# Patient Record
Sex: Male | Born: 1985 | Race: White | Hispanic: No | Marital: Married | Smoking: Current every day smoker
Health system: Southern US, Community
[De-identification: ages and names within clinical notes are randomized; demographics above are authoritative.]

## PROBLEM LIST (undated history)

## (undated) HISTORY — PX: HERNIA REPAIR: SHX51

---

## 2014-04-15 ENCOUNTER — Emergency Department (HOSPITAL_COMMUNITY)
Admission: EM | Admit: 2014-04-15 | Discharge: 2014-04-15 | Disposition: A | Discharge status: Home / Self Care | Payer: No Typology Code available for payment source | Attending: Emergency Medicine | Admitting: Emergency Medicine

## 2014-04-15 ENCOUNTER — Emergency Department (HOSPITAL_COMMUNITY): Payer: No Typology Code available for payment source

## 2014-04-15 ENCOUNTER — Encounter (HOSPITAL_COMMUNITY): Payer: Self-pay | Admitting: Emergency Medicine

## 2014-04-15 DIAGNOSIS — N200 Calculus of kidney: Secondary | ICD-10-CM | POA: Insufficient documentation

## 2014-04-15 DIAGNOSIS — F17200 Nicotine dependence, unspecified, uncomplicated: Secondary | ICD-10-CM | POA: Insufficient documentation

## 2014-04-15 DIAGNOSIS — R109 Unspecified abdominal pain: Secondary | ICD-10-CM | POA: Insufficient documentation

## 2014-04-15 LAB — URINE MICROSCOPIC-ADD ON

## 2014-04-15 LAB — URINALYSIS, ROUTINE W REFLEX MICROSCOPIC
BILIRUBIN URINE: NEGATIVE
Glucose, UA: NEGATIVE mg/dL
Ketones, ur: NEGATIVE mg/dL
Leukocytes, UA: NEGATIVE
Nitrite: NEGATIVE
Protein, ur: NEGATIVE mg/dL
Specific Gravity, Urine: 1.022 (ref 1.005–1.030)
UROBILINOGEN UA: 0.2 mg/dL (ref 0.0–1.0)
pH: 6.5 (ref 5.0–8.0)

## 2014-04-15 MED ORDER — ONDANSETRON HCL 4 MG/2ML IJ SOLN
4.0000 mg | Freq: Once | INTRAMUSCULAR | Status: AC
  Administered 2014-04-15: 4 mg via INTRAVENOUS
  Filled 2014-04-15: qty 2

## 2014-04-15 MED ORDER — MORPHINE SULFATE 4 MG/ML IJ SOLN
4.0000 mg | Freq: Once | INTRAMUSCULAR | Status: AC
  Administered 2014-04-15: 4 mg via INTRAVENOUS
  Filled 2014-04-15: qty 1

## 2014-04-15 MED ORDER — OXYCODONE-ACETAMINOPHEN 5-325 MG PO TABS: 1.0000 | ORAL_TABLET | Freq: Four times a day (QID) | ORAL | Status: DC | PRN

## 2014-04-15 MED ORDER — ONDANSETRON HCL 4 MG PO TABS: 4.0000 mg | ORAL_TABLET | Freq: Three times a day (TID) | ORAL | Status: DC | PRN

## 2014-04-15 MED ORDER — TAMSULOSIN HCL 0.4 MG PO CAPS
0.4000 mg | ORAL_CAPSULE | Freq: Every day | ORAL | Status: DC
Start: 2014-04-15 — End: 2014-07-26

## 2014-04-15 NOTE — ED Notes (Signed)
Per pt report: 3 hours prior to arrival, pt got up to use the bathroom and was unable to urinate. Pt then began to experience sever right flank pain.  Pain does not radiate.  Pt reports family hx kidney stone.  Pt a/o x 4.

## 2014-04-15 NOTE — ED Provider Notes (Signed)
CSN: [id number]     Arrival date & time 04/15/14  [id number] History   First MD Initiated Contact with Patient 04/15/14 [phone]     Chief Complaint  Patient presents with  . Flank Pain     (Consider location/radiation/quality/duration/timing/severity/associated sxs/prior Treatment) HPI Comments: Patient is a 28 yo M presenting to the ED for acute onset R flank pain with radiation into abdomen at three AM this morning with associated nausea. Alleviating factors: none. Aggravating factors: certain positions. Medications tried prior to arrival: none. Denies any fevers, emesis, diarrhea, constipation. Abdominal surgical history: hernia repair.     Patient is a 28 y.o. male presenting with flank pain.  Flank Pain This is a new (R flank) problem. The current episode started today. The problem occurs constantly. The problem has been waxing and waning. Associated symptoms include abdominal pain and nausea. Pertinent negatives include no arthralgias, fever, headaches, numbness, rash, vomiting or weakness. Exacerbated by: Certain positions. He has tried nothing for the symptoms. The treatment provided no relief.    History reviewed. No pertinent past medical history. Past Surgical History  Procedure Laterality Date  . Hernia repair     No family history on file. History  Substance Use Topics  . Smoking status: Current Every Day Smoker -- 1.00 packs/day    Types: Cigarettes  . Smokeless tobacco: Not on file  . Alcohol Use: Yes     Comment: rarley    Review of Systems  Constitutional: Negative for fever.  Gastrointestinal: Positive for nausea and abdominal pain. Negative for vomiting.  Genitourinary: Positive for urgency, frequency, flank pain and decreased urine volume. Negative for dysuria and hematuria.  Musculoskeletal: Negative for arthralgias.  Skin: Negative for rash.  Neurological: Negative for weakness, numbness and headaches.  All other systems reviewed and are  negative.     Allergies  Review of patient's allergies indicates no known allergies.  Home Medications   Prior to Admission medications   Medication Sig Start Date End Date Taking? Authorizing Provider  ondansetron (ZOFRAN) 4 MG tablet Take 1 tablet (4 mg total) by mouth every 8 (eight) hours as needed for nausea or vomiting. 04/15/14   Lise AuerJennifer L Amr Sturtevant, PA-C  oxyCODONE-acetaminophen (PERCOCET) 5-325 MG per tablet Take 1-2 tablets by mouth every 6 (six) hours as needed for severe pain. 04/15/14   Arlena Marsan L Isaias Dowson, PA-C  tamsulosin (FLOMAX) 0.4 MG CAPS capsule Take 1 capsule (0.4 mg total) by mouth daily. 04/15/14   Damoni Causby L Koron Godeaux, PA-C   BP 108/61  Pulse 61  Temp(Src) 98.1 F (36.7 C) (Oral)  Resp 16  SpO2 98% Physical Exam  Nursing note and vitals reviewed. Constitutional: He is oriented to person, place, and time. He appears well-developed and well-nourished. No distress.  HENT:  Head: Normocephalic and atraumatic.  Right Ear: External ear normal.  Left Ear: External ear normal.  Nose: Nose normal.  Mouth/Throat: Oropharynx is clear and moist. No oropharyngeal exudate.  Eyes: Conjunctivae are normal.  Neck: Normal range of motion. Neck supple.  Cardiovascular: Normal rate, regular rhythm and normal heart sounds.   Pulmonary/Chest: Effort normal and breath sounds normal. No respiratory distress.  Abdominal: Soft. Normal appearance and bowel sounds are normal. There is no tenderness. There is CVA tenderness (R). There is no rigidity, no rebound and no guarding.  Musculoskeletal: Normal range of motion.  Neurological: He is alert and oriented to person, place, and time.  Skin: Skin is warm and dry. He is not diaphoretic.  Psychiatric: He has a  normal mood and affect.    ED Course  Procedures (including critical care time) Medications  ondansetron (ZOFRAN) injection 4 mg (4 mg Intravenous Given 04/15/14 0739)  morphine 4 MG/ML injection 4 mg (4 mg  Intravenous Given 04/15/14 0739)    Labs Review Labs Reviewed  URINALYSIS, ROUTINE W REFLEX MICROSCOPIC - Abnormal; Notable for the following:    APPearance CLOUDY (*)    Hgb urine dipstick LARGE (*)    All other components within normal limits  URINE MICROSCOPIC-ADD ON - Abnormal; Notable for the following:    Bacteria, UA FEW (*)    All other components within normal limits  URINE CULTURE    Imaging Review Ct Abdomen Pelvis Wo Contrast  04/15/2014   CLINICAL DATA:  Right flank and back pain.  EXAM: CT ABDOMEN AND PELVIS WITHOUT CONTRAST  TECHNIQUE: Multidetector CT imaging of the abdomen and pelvis was performed following the standard protocol without IV contrast.  COMPARISON:  None.  FINDINGS: Lung bases are clear.  No effusions.  Heart is normal size.  Liver, gallbladder, spleen, pancreas, adrenals and left kidney have an unremarkable unenhanced appearance.  Mild right hydronephrosis and hydroureter due to punctate 2 mm stone at the right ureterovesical junction. Urinary bladder is decompressed. Prostate grossly unremarkable.  Appendix is visualized and is normal. Stomach, large and small bowel grossly unremarkable. No free fluid, free air or adenopathy. No acute bony abnormality.  IMPRESSION: 2 mm right UVJ stone with mild right hydronephrosis PICC   Electronically Signed   By: Charlett Nose M.D.   On: 04/15/2014 07:58     EKG Interpretation None      MDM   Final diagnoses:  Kidney stone    Filed Vitals:   04/15/14 0948  BP: 108/61  Pulse: 61  Temp: 98.1 F (36.7 C)  Resp: 16   Afebrile, NAD, non-toxic appearing, AAOx4.   Pt has been diagnosed with a Kidney Stone via CT. There is no evidence of significant hydronephrosis, vitals sign stable and the pt does not have irratractable vomiting. Nitrate negative UA w/ few bacteria, no leukocytes, urine culture sent. Will hold off on Abx treatment at this time pending culture results.  Pt will be dc home with pain medications &  has been advised to follow up with PCP. Patient is agreeable to plan. Patient is stable at time of discharge Patient d/w with Dr. Micheline Maze, agrees with plan.      Lise Auer Bhavesh Vazquez, PA-C 04/15/14 1000  Jeannetta Ellis, PA-C 04/15/14 1001

## 2014-04-15 NOTE — ED Provider Notes (Signed)
Medical screening examination/treatment/procedure(s) were performed by non-physician practitioner and as supervising physician I was immediately available for consultation/collaboration.   EKG Interpretation None        Shanna CiscoMegan E Docherty, MD 04/15/14 1920

## 2014-04-15 NOTE — Discharge Instructions (Signed)
Please follow up with your primary care physician in 1-2 days. If you do not have one please call the [facility] number listed above. Please follow up as needed with Dr. Patsi Searsannenbaum, urologist. Please take pain medication and/or muscle relaxants as prescribed and as needed for pain. Please do not drive on narcotic pain medication or on muscle relaxants. Please read all discharge instructions and return precautions.    Kidney Stones Kidney stones (urolithiasis) are deposits that form inside your kidneys. The intense pain is caused by the stone moving through the urinary tract. When the stone moves, the ureter goes into spasm around the stone. The stone is usually passed in the urine.  CAUSES   A disorder that makes certain neck glands produce too much parathyroid hormone (primary hyperparathyroidism).  A buildup of uric acid crystals, similar to gout in your joints.  Narrowing (stricture) of the ureter.  A kidney obstruction present at birth (congenital obstruction).  Previous surgery on the kidney or ureters.  Numerous kidney infections. SYMPTOMS   Feeling sick to your stomach (nauseous).  Throwing up (vomiting).  Blood in the urine (hematuria).  Pain that usually spreads (radiates) to the groin.  Frequency or urgency of urination. DIAGNOSIS   Taking a history and physical exam.  Blood or urine tests.  CT scan.  Occasionally, an examination of the inside of the urinary bladder (cystoscopy) is performed. TREATMENT   Observation.  Increasing your fluid intake.  Extracorporeal shock wave lithotripsy--This is a noninvasive procedure that uses shock waves to break up kidney stones.  Surgery may be needed if you have severe pain or persistent obstruction. There are various surgical procedures. Most of the procedures are performed with the use of small instruments. Only small incisions are needed to accommodate these instruments, so recovery time is  minimized. The size, location, and chemical composition are all important variables that will determine the proper choice of action for you. Talk to your health care provider to better understand your situation so that you will minimize the risk of injury to yourself and your kidney.  HOME CARE INSTRUCTIONS   Drink enough water and fluids to keep your urine clear or pale yellow. This will help you to pass the stone or stone fragments.  Strain all urine through the provided strainer. Keep all particulate matter and stones for your health care provider to see. The stone causing the pain may be as small as a grain of salt. It is very important to use the strainer each and every time you pass your urine. The collection of your stone will allow your health care provider to analyze it and verify that a stone has actually passed. The stone analysis will often identify what you can do to reduce the incidence of recurrences.  Only take over-the-counter or prescription medicines for pain, discomfort, or fever as directed by your health care provider.  Make a follow-up appointment with your health care provider as directed.  Get follow-up X-rays if required. The absence of pain does not always mean that the stone has passed. It may have only stopped moving. If the urine remains completely obstructed, it can cause loss of kidney function or even complete destruction of the kidney. It is your responsibility to make sure X-rays and follow-ups are completed. Ultrasounds of the kidney can show blockages and the status of the kidney. Ultrasounds are not associated with any radiation and can be performed easily in a matter of minutes. SEEK MEDICAL CARE IF:  You experience pain that is progressive and unresponsive to any pain medicine you have been prescribed. SEEK IMMEDIATE MEDICAL CARE IF:   Pain cannot be controlled with the prescribed medicine.  You have a fever or shaking chills.  The severity or intensity  of pain increases over 18 hours and is not relieved by pain medicine.  You develop a new onset of abdominal pain.  You feel faint or pass out.  You are unable to urinate. MAKE SURE YOU:   Understand these instructions.  Will watch your condition.  Will get help right away if you are not doing well or get worse. Document Released: 09/09/2005 Document Revised: 05/12/2013 Document Reviewed: 02/10/2013 [facility] Patient Information 2015 [place], [place]. This information is not intended to replace advice given to you by your health care provider. Make sure you discuss any questions you have with your health care provider.

## 2014-04-16 LAB — URINE CULTURE
Colony Count: NO GROWTH
Culture: NO GROWTH

## 2014-07-26 ENCOUNTER — Emergency Department (HOSPITAL_COMMUNITY): Payer: No Typology Code available for payment source

## 2014-07-26 ENCOUNTER — Emergency Department (HOSPITAL_COMMUNITY)
Admission: EM | Admit: 2014-07-26 | Discharge: 2014-07-26 | Disposition: A | Discharge status: Home / Self Care | Payer: Self-pay | Attending: Emergency Medicine | Admitting: Emergency Medicine

## 2014-07-26 ENCOUNTER — Encounter (HOSPITAL_COMMUNITY): Payer: Self-pay | Admitting: *Deleted

## 2014-07-26 DIAGNOSIS — N200 Calculus of kidney: Secondary | ICD-10-CM | POA: Insufficient documentation

## 2014-07-26 DIAGNOSIS — Z720 Tobacco use: Secondary | ICD-10-CM | POA: Insufficient documentation

## 2014-07-26 DIAGNOSIS — R112 Nausea with vomiting, unspecified: Secondary | ICD-10-CM | POA: Insufficient documentation

## 2014-07-26 LAB — URINALYSIS, ROUTINE W REFLEX MICROSCOPIC
Bilirubin Urine: NEGATIVE
Glucose, UA: NEGATIVE mg/dL
Ketones, ur: NEGATIVE mg/dL
Nitrite: NEGATIVE
Protein, ur: NEGATIVE mg/dL
Specific Gravity, Urine: 1.022 (ref 1.005–1.030)
UROBILINOGEN UA: 0.2 mg/dL (ref 0.0–1.0)
pH: 8 (ref 5.0–8.0)

## 2014-07-26 LAB — BASIC METABOLIC PANEL
Anion gap: 17 — ABNORMAL HIGH (ref 5–15)
BUN: 16 mg/dL (ref 6–23)
CALCIUM: 9.3 mg/dL (ref 8.4–10.5)
CO2: 20 mEq/L (ref 19–32)
Chloride: 102 mEq/L (ref 96–112)
Creatinine, Ser: 1.03 mg/dL (ref 0.50–1.35)
Glucose, Bld: 134 mg/dL — ABNORMAL HIGH (ref 70–99)
Potassium: 4.7 mEq/L (ref 3.7–5.3)
Sodium: 139 mEq/L (ref 137–147)

## 2014-07-26 LAB — CBC WITH DIFFERENTIAL/PLATELET
Basophils Absolute: 0 10*3/uL (ref 0.0–0.1)
Basophils Relative: 0 % (ref 0–1)
EOS PCT: 1 % (ref 0–5)
Eosinophils Absolute: 0.2 10*3/uL (ref 0.0–0.7)
HCT: 45.3 % (ref 39.0–52.0)
Hemoglobin: 15 g/dL (ref 13.0–17.0)
LYMPHS ABS: 1.7 10*3/uL (ref 0.7–4.0)
Lymphocytes Relative: 10 % — ABNORMAL LOW (ref 12–46)
MCH: 29.1 pg (ref 26.0–34.0)
MCHC: 33.1 g/dL (ref 30.0–36.0)
MCV: 87.8 fL (ref 78.0–100.0)
MONO ABS: 1 10*3/uL (ref 0.1–1.0)
Monocytes Relative: 6 % (ref 3–12)
Neutro Abs: 13.6 10*3/uL — ABNORMAL HIGH (ref 1.7–7.7)
Neutrophils Relative %: 83 % — ABNORMAL HIGH (ref 43–77)
PLATELETS: 302 10*3/uL (ref 150–400)
RBC: 5.16 MIL/uL (ref 4.22–5.81)
RDW: 13.8 % (ref 11.5–15.5)
WBC: 16.5 10*3/uL — ABNORMAL HIGH (ref 4.0–10.5)

## 2014-07-26 LAB — URINE MICROSCOPIC-ADD ON

## 2014-07-26 MED ORDER — KETOROLAC TROMETHAMINE 30 MG/ML IJ SOLN
30.0000 mg | Freq: Once | INTRAMUSCULAR | Status: AC
  Administered 2014-07-26: 30 mg via INTRAVENOUS
  Filled 2014-07-26: qty 1

## 2014-07-26 MED ORDER — SODIUM CHLORIDE 0.9 % IV BOLUS (SEPSIS)
1000.0000 mL | Freq: Once | INTRAVENOUS | Status: AC
  Administered 2014-07-26: 1000 mL via INTRAVENOUS

## 2014-07-26 MED ORDER — MORPHINE SULFATE 4 MG/ML IJ SOLN
4.0000 mg | Freq: Once | INTRAMUSCULAR | Status: AC
  Administered 2014-07-26: 4 mg via INTRAVENOUS
  Filled 2014-07-26: qty 1

## 2014-07-26 MED ORDER — ONDANSETRON HCL 4 MG/2ML IJ SOLN
4.0000 mg | Freq: Once | INTRAMUSCULAR | Status: AC
  Administered 2014-07-26: 4 mg via INTRAVENOUS
  Filled 2014-07-26: qty 2

## 2014-07-26 MED ORDER — OXYCODONE-ACETAMINOPHEN 5-325 MG PO TABS: 1.0000 | ORAL_TABLET | Freq: Four times a day (QID) | ORAL | Status: DC | PRN

## 2014-07-26 MED ORDER — ONDANSETRON HCL 4 MG PO TABS: 4.0000 mg | ORAL_TABLET | Freq: Three times a day (TID) | ORAL | Status: DC | PRN

## 2014-07-26 MED ORDER — TAMSULOSIN HCL 0.4 MG PO CAPS: 0.4000 mg | ORAL_CAPSULE | Freq: Every day | ORAL | Status: DC

## 2014-07-26 MED ORDER — MORPHINE SULFATE 4 MG/ML IJ SOLN
4.0000 mg | Freq: Once | INTRAMUSCULAR | Status: AC
Start: 2014-07-26 — End: 2014-07-26
  Administered 2014-07-26: 4 mg via INTRAVENOUS
  Filled 2014-07-26: qty 1

## 2014-07-26 NOTE — ED Notes (Signed)
AVS explained in detail. Given strainer. Knows to follow up with urology and given information. Knows not to drink/drive/operate heavy machinery with medications. No other questions/concerns. Ambulatory with steady gait.

## 2014-07-26 NOTE — Discharge Instructions (Signed)

## 2014-07-26 NOTE — Progress Notes (Signed)
[facility] Stacy,  ° °Provided pt with a list of primary care resources and a [facility] Card application to help patient establish primary care.  °

## 2014-07-26 NOTE — ED Notes (Signed)
Patient is unable to urinate at this time, he will try again later.

## 2014-07-26 NOTE — ED Notes (Signed)
Fluids given per MD. No vomiting, nausea noted.

## 2014-07-26 NOTE — ED Notes (Signed)
Pt aware urine sample is needed. Unable to urinate at this time.

## 2014-07-26 NOTE — ED Notes (Signed)
Patient transported to Ultrasound 

## 2014-07-26 NOTE — ED Provider Notes (Signed)
CSN: [id number]     Arrival date & time 07/26/14  [id number] History   First MD Initiated Contact with Patient 07/26/14 0735     Chief Complaint  Patient presents with  . Flank Pain     (Consider location/radiation/quality/duration/timing/severity/associated sxs/prior Treatment) HPI  This is a 28 year old male who presents with left-sided flank pain. Patient reports onset of symptoms this morning at 5 AM. He reports the sharp in his left flank and radiates into his left lower quadrant. It is currently 10 out of 10. He has had associated nausea and bilious, nonbloody emesis. Patient reports history of kidney stones but felt the same. Denies any hematuria or dysuria. He denies any fevers.   History reviewed. No pertinent past medical history. Past Surgical History  Procedure Laterality Date  . Hernia repair     No family history on file. History  Substance Use Topics  . Smoking status: Current Every Day Smoker -- 1.00 packs/day    Types: Cigarettes  . Smokeless tobacco: Not on file  . Alcohol Use: Yes     Comment: rarley    Review of Systems  Constitutional: Negative.  Negative for fever.  Respiratory: Negative.  Negative for chest tightness and shortness of breath.   Cardiovascular: Negative.  Negative for chest pain.  Gastrointestinal: Positive for nausea and vomiting. Negative for abdominal pain and diarrhea.  Genitourinary: Positive for flank pain. Negative for dysuria and hematuria.  Musculoskeletal: Negative for back pain.  Neurological: Negative for headaches.  All other systems reviewed and are negative.     Allergies  Review of patient's allergies indicates no known allergies.  Home Medications   Prior to Admission medications   Medication Sig Start Date End Date Taking? Authorizing Provider  ondansetron (ZOFRAN) 4 MG tablet Take 1 tablet (4 mg total) by mouth every 8 (eight) hours as needed for nausea or vomiting. 07/26/14   Shon Batonourtney F Caedan Sumler, MD   oxyCODONE-acetaminophen (PERCOCET) 5-325 MG per tablet Take 1-2 tablets by mouth every 6 (six) hours as needed for severe pain. 07/26/14   Shon Batonourtney F Tanda Morrissey, MD  tamsulosin (FLOMAX) 0.4 MG CAPS capsule Take 1 capsule (0.4 mg total) by mouth daily. 07/26/14   Shon Batonourtney F Jani Ploeger, MD   BP 103/61 mmHg  Pulse 60  Temp(Src) 97.4 F (36.3 C) (Oral)  Resp 18  SpO2 97% Physical Exam  Constitutional: He is oriented to person, place, and time. No distress.  Uncomfortable appearing  HENT:  Head: Normocephalic and atraumatic.  Cardiovascular: Normal rate, regular rhythm and normal heart sounds.   No murmur heard. Pulmonary/Chest: Effort normal and breath sounds normal. No respiratory distress. He has no wheezes.  Abdominal: Soft. Bowel sounds are normal. There is no tenderness. There is no rebound and no guarding.  Genitourinary:  No CVA tenderness  Musculoskeletal: He exhibits no edema.  Neurological: He is alert and oriented to person, place, and time.  Skin: Skin is warm and dry.  Psychiatric: He has a normal mood and affect.  Nursing note and vitals reviewed.   ED Course  Procedures (including critical care time) Labs Review Labs Reviewed  BASIC METABOLIC PANEL - Abnormal; Notable for the following:    Glucose, Bld 134 (*)    Anion gap 17 (*)    All other components within normal limits  URINALYSIS, ROUTINE W REFLEX MICROSCOPIC - Abnormal; Notable for the following:    Color, Urine AMBER (*)    APPearance CLOUDY (*)    Hgb urine dipstick LARGE (*)  Leukocytes, UA TRACE (*)    All other components within normal limits  CBC WITH DIFFERENTIAL - Abnormal; Notable for the following:    WBC 16.5 (*)    Neutrophils Relative % 83 (*)    Neutro Abs 13.6 (*)    Lymphocytes Relative 10 (*)    All other components within normal limits  URINE CULTURE  URINE MICROSCOPIC-ADD ON  CBC WITH DIFFERENTIAL    Imaging Review [place] Ren[facility]  07/26/2014   CLINICAL DATA:  Kidney stone.  EXAM:  RENAL/URINARY TRACT ULTRASOUND COMPLETE  COMPARISON:  None.  FINDINGS: Right Kidney:  Length: 10 x 4 cm. Echogenicity within normal limits. No mass or hydronephrosis visualized.  Left Kidney:  Length: 11.5 cm. A measured 2 cm echogenic area with in the interpolar left kidney is likely prominent cortex rather than mass. There is no indication of mass on abdominal ultrasound 04/16/2014. No hydronephrosis.  Bladder:  No wall thickening or internal debris.  Ureteral jets noted.  IMPRESSION: No hydronephrosis.   Electronically Signed   By: Tiburcio PeaJonathan  Watts M.D.   On: 07/26/2014 10:22     EKG Interpretation None      MDM   Final diagnoses:  Kidney stone    Patient presents with pain consistent with prior kidney stones. Uncomfortable appearing but nontoxic on exam.  Afebrile. Pain and nausea medication given.  Have reviewed prior CT from July which did show kidney stones at that time. Will obtain a renal ultrasound to evaluate for hydronephrosis.  Lab work shows leukocytosis to 16.5 with a left shift. Patient is afebrile. Urinalysis without evidence of overt infection. However given that there are 3-6 white cells will send for culture. Leukocytosis could be secondary to acute stress response. On recheck, patient is more comfortable following pain medications and fluids. Ultrasound is reassuring without evidence of hydronephrosis. Discuss with patient conservative and expectant management. Will be discharged home with pain medication, Flomax, and Zofran. Patient was given urology referral.  After history, exam, and medical workup I feel the patient has been appropriately medically screened and is safe for discharge home. Pertinent diagnoses were discussed with the patient. Patient was given return precautions.   Shon Batonourtney F Jewelia Bocchino, MD 07/26/14 [phone]

## 2014-07-26 NOTE — ED Notes (Signed)
History of kidney stone two months; c/o left flank pain since 5am; vomiting; unable to urinate; writhing in pain

## 2014-07-27 LAB — URINE CULTURE
COLONY COUNT: NO GROWTH
Culture: NO GROWTH
SPECIAL REQUESTS: NORMAL

## 2015-05-07 IMAGING — US US RENAL
1 series · 14 of 25 positions shown · non-contrast
Comparison: None.

CLINICAL DATA: Kidney stone.

EXAM:
RENAL/URINARY TRACT ULTRASOUND COMPLETE

[Series 1: us renal · 0.22mm/px · 14 of 46 slices shown]
[im 1/46]
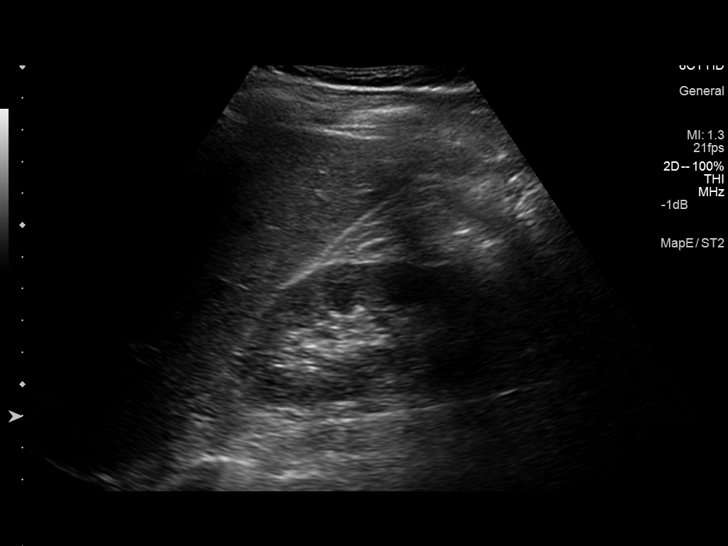
[im 4/46]
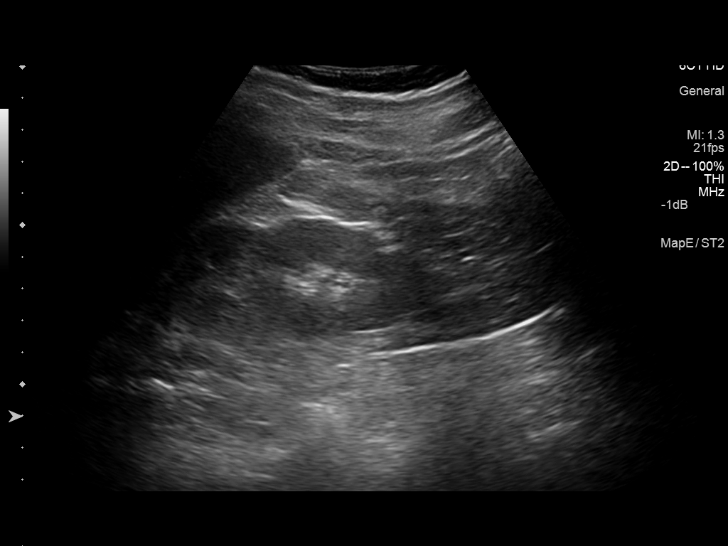
[im 8/46]
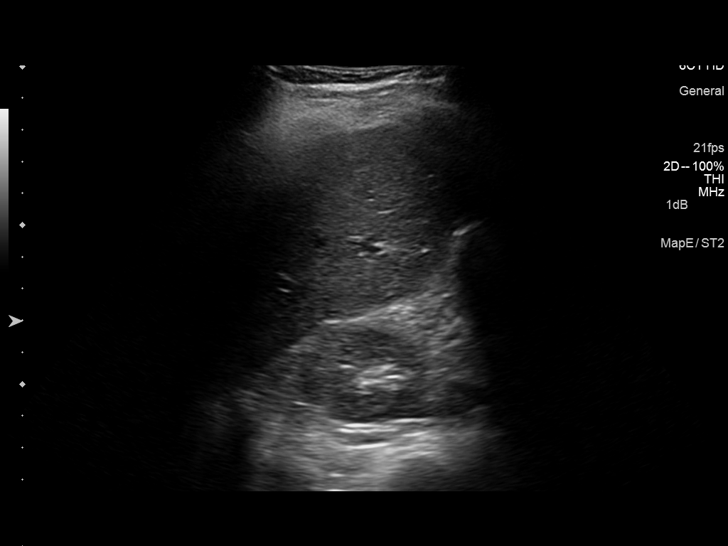
[im 12/46]
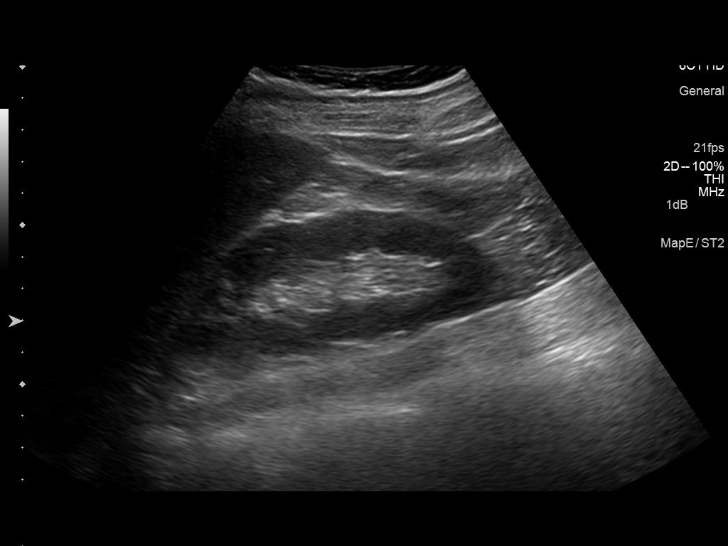
[im 16/46]
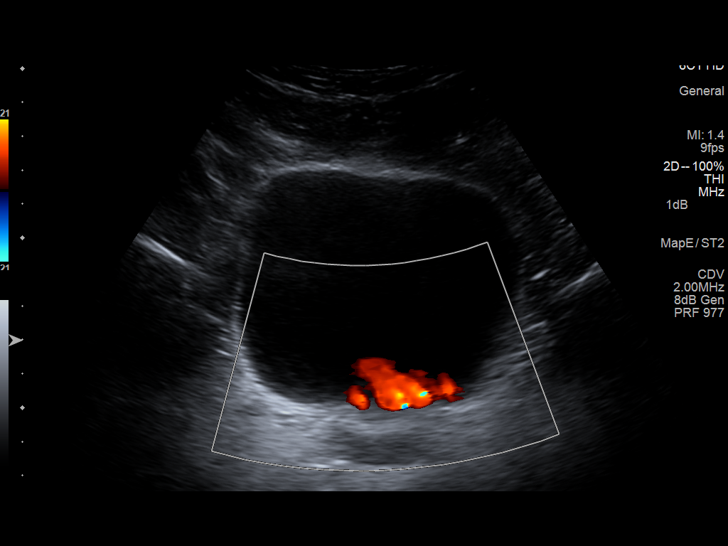
[im 17/46]
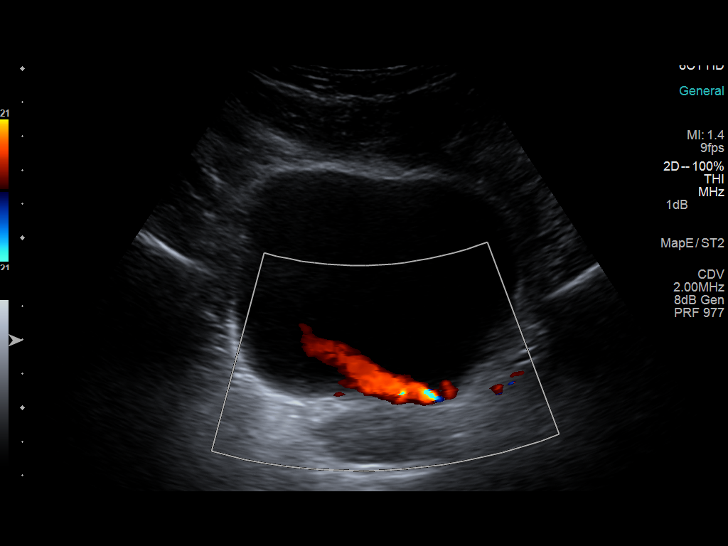
[im 21/46]
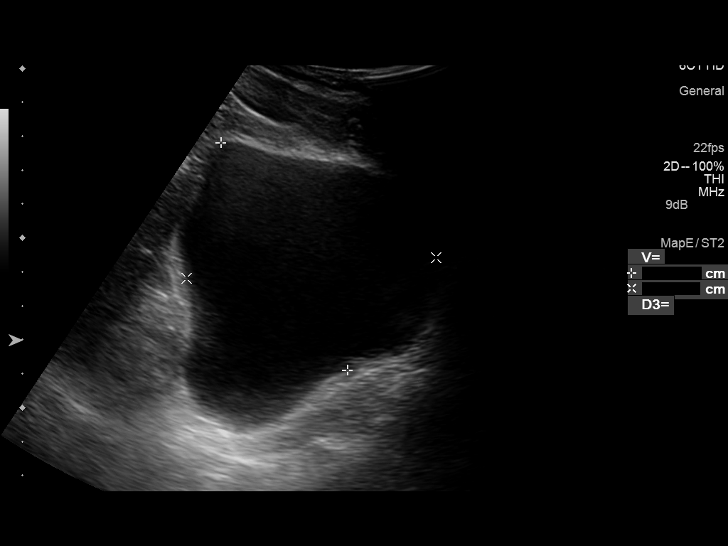
[im 25/46]
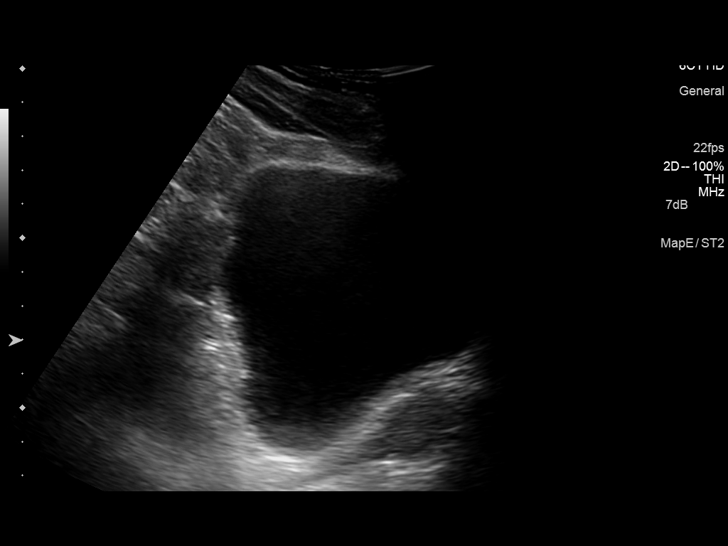
[im 29/46]
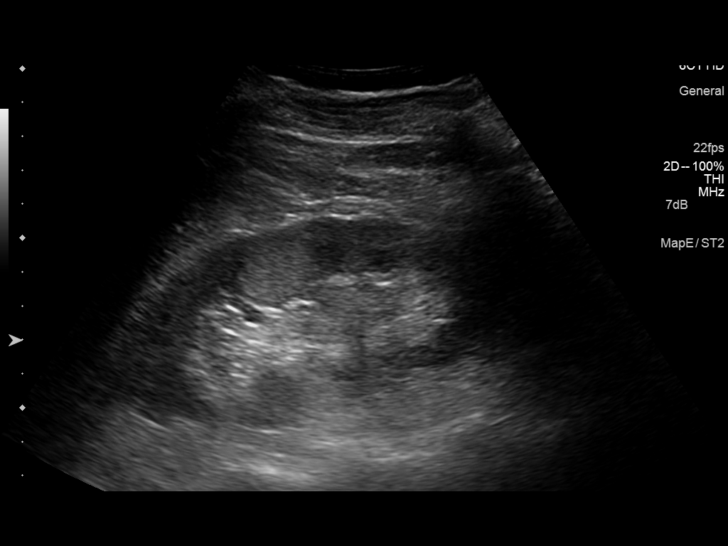
[im 31/46]
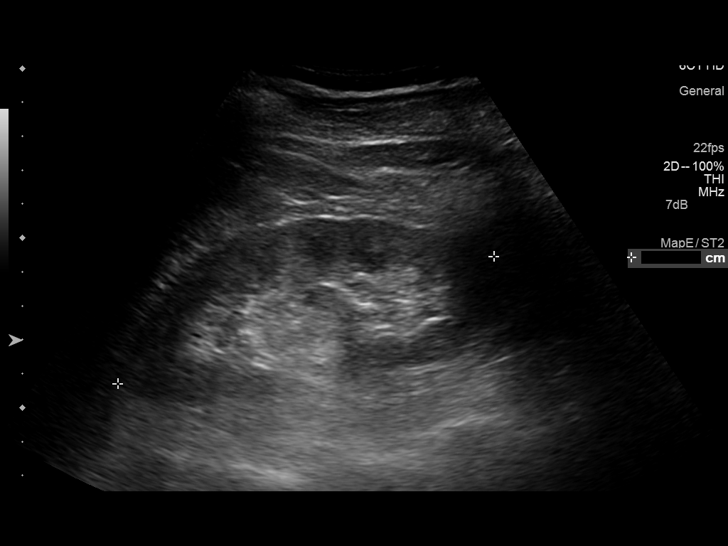
[im 34/46]
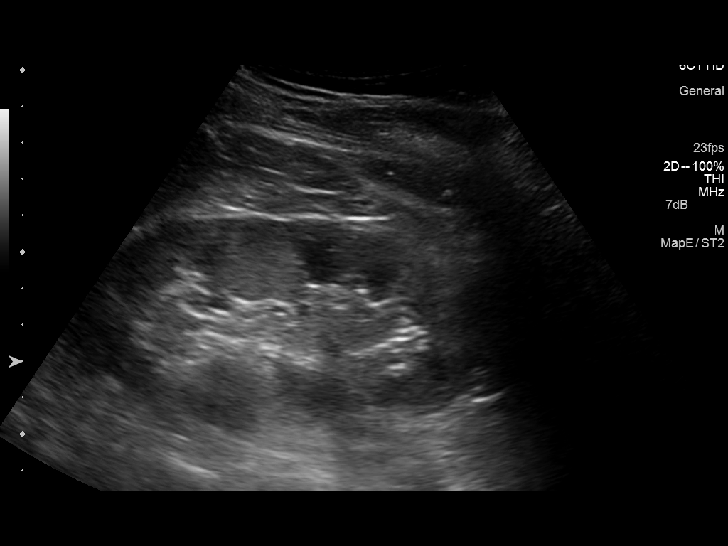
[im 38/46]
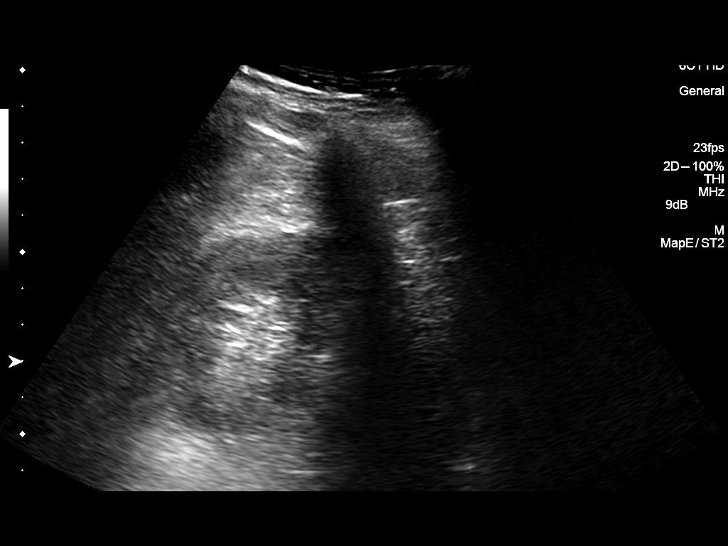
[im 42/46]
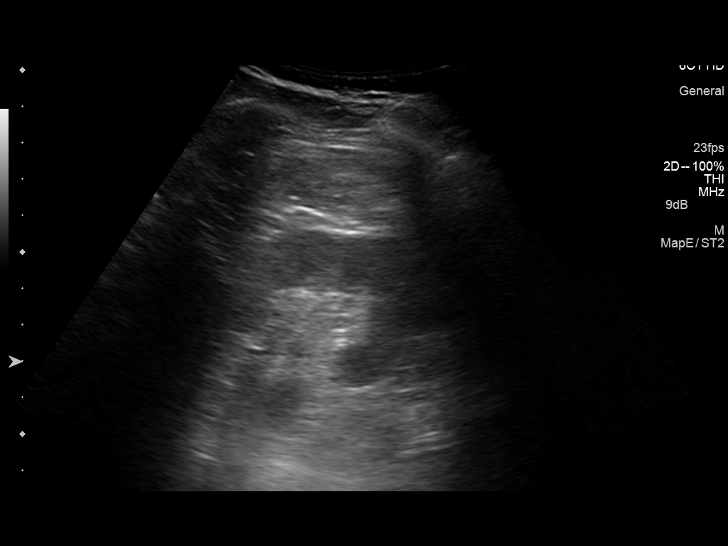
[im 46/46]
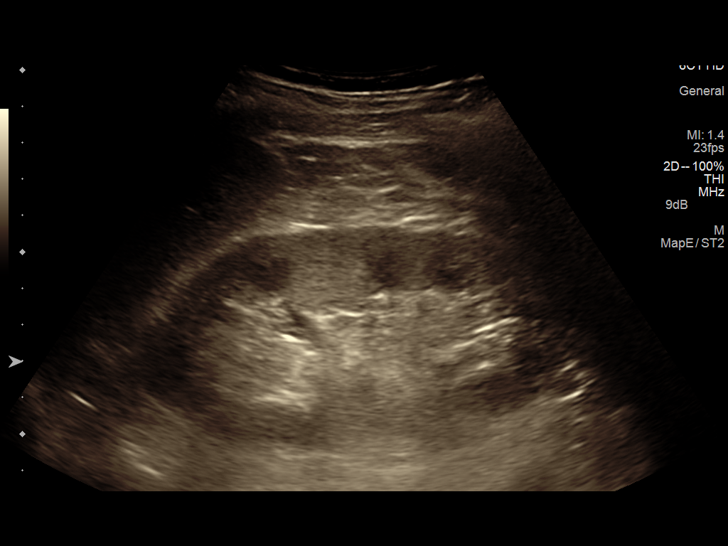

[14 of 25 positions shown; findings below may reference images not displayed]

FINDINGS: Right Kidney:

Length: 10 x 4 cm. Echogenicity within normal limits. No mass or
hydronephrosis visualized.

Left Kidney:

Length: 11.5 cm. A measured 2 cm echogenic area with in the
interpolar left kidney is likely prominent cortex rather than mass.
There is no indication of mass on abdominal ultrasound 04/16/2014.
No hydronephrosis.

Bladder:

No wall thickening or internal debris.  Ureteral jets noted.
IMPRESSION: No hydronephrosis.

## 2016-09-27 ENCOUNTER — Ambulatory Visit: Payer: No Typology Code available for payment source | Admitting: Family Medicine

## 2017-12-03 ENCOUNTER — Ambulatory Visit (INDEPENDENT_AMBULATORY_CARE_PROVIDER_SITE_OTHER): Payer: Non-veteran care | Admitting: Psychiatry

## 2017-12-03 ENCOUNTER — Encounter (HOSPITAL_COMMUNITY): Payer: Self-pay | Admitting: Psychiatry

## 2017-12-03 VITALS — BP 126/70 | HR 76 | Ht 73.0 in | Wt 156.0 lb

## 2017-12-03 DIAGNOSIS — Z9889 Other specified postprocedural states: Secondary | ICD-10-CM

## 2017-12-03 DIAGNOSIS — F17210 Nicotine dependence, cigarettes, uncomplicated: Secondary | ICD-10-CM

## 2017-12-03 DIAGNOSIS — Z8719 Personal history of other diseases of the digestive system: Secondary | ICD-10-CM

## 2017-12-03 DIAGNOSIS — G4700 Insomnia, unspecified: Secondary | ICD-10-CM

## 2017-12-03 DIAGNOSIS — Z634 Disappearance and death of family member: Secondary | ICD-10-CM

## 2017-12-03 DIAGNOSIS — F419 Anxiety disorder, unspecified: Secondary | ICD-10-CM | POA: Diagnosis not present

## 2017-12-03 DIAGNOSIS — F3130 Bipolar disorder, current episode depressed, mild or moderate severity, unspecified: Secondary | ICD-10-CM

## 2017-12-03 MED ORDER — LAMOTRIGINE 25 MG PO TABS: ORAL_TABLET | ORAL | 0 refills | Status: DC
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
## 2017-12-24 ENCOUNTER — Ambulatory Visit (INDEPENDENT_AMBULATORY_CARE_PROVIDER_SITE_OTHER): Payer: Non-veteran care | Admitting: Psychiatry

## 2017-12-24 ENCOUNTER — Encounter (HOSPITAL_COMMUNITY): Payer: Self-pay | Admitting: Psychiatry

## 2017-12-24 ENCOUNTER — Encounter (HOSPITAL_COMMUNITY): Payer: Self-pay | Admitting: Internal Medicine

## 2017-12-24 DIAGNOSIS — F17210 Nicotine dependence, cigarettes, uncomplicated: Secondary | ICD-10-CM

## 2017-12-24 DIAGNOSIS — F419 Anxiety disorder, unspecified: Secondary | ICD-10-CM

## 2017-12-24 DIAGNOSIS — F3130 Bipolar disorder, current episode depressed, mild or moderate severity, unspecified: Secondary | ICD-10-CM | POA: Diagnosis not present

## 2017-12-24 DIAGNOSIS — F1210 Cannabis abuse, uncomplicated: Secondary | ICD-10-CM | POA: Diagnosis not present

## 2017-12-24 MED ORDER — LAMOTRIGINE 25 MG PO TABS: ORAL_TABLET | ORAL | 1 refills | Status: DC
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
## 2018-02-11 ENCOUNTER — Ambulatory Visit (HOSPITAL_COMMUNITY): Payer: Non-veteran care | Admitting: Psychiatry

## 2018-02-24 ENCOUNTER — Ambulatory Visit (HOSPITAL_COMMUNITY): Payer: Self-pay | Admitting: Psychiatry

## 2018-03-10 ENCOUNTER — Other Ambulatory Visit (HOSPITAL_COMMUNITY): Payer: Self-pay | Admitting: Psychiatry

## 2018-03-10 DIAGNOSIS — F3130 Bipolar disorder, current episode depressed, mild or moderate severity, unspecified: Secondary | ICD-10-CM

## 2018-03-24 ENCOUNTER — Ambulatory Visit (INDEPENDENT_AMBULATORY_CARE_PROVIDER_SITE_OTHER): Payer: No Typology Code available for payment source | Admitting: Psychiatry

## 2018-03-24 ENCOUNTER — Encounter (HOSPITAL_COMMUNITY): Payer: Self-pay | Admitting: Psychiatry

## 2018-03-24 DIAGNOSIS — F17210 Nicotine dependence, cigarettes, uncomplicated: Secondary | ICD-10-CM

## 2018-03-24 DIAGNOSIS — F3130 Bipolar disorder, current episode depressed, mild or moderate severity, unspecified: Secondary | ICD-10-CM

## 2018-03-24 MED ORDER — LAMOTRIGINE 100 MG PO TABS: 100.0000 mg | ORAL_TABLET | Freq: Every day | ORAL | 0 refills | Status: DC
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
## 2018-06-24 ENCOUNTER — Ambulatory Visit (HOSPITAL_COMMUNITY): Payer: Self-pay | Admitting: Psychiatry

## 2018-06-29 ENCOUNTER — Other Ambulatory Visit (HOSPITAL_COMMUNITY): Payer: Self-pay

## 2018-06-29 DIAGNOSIS — F3130 Bipolar disorder, current episode depressed, mild or moderate severity, unspecified: Secondary | ICD-10-CM

## 2018-06-29 MED ORDER — LAMOTRIGINE 100 MG PO TABS: 100.0000 mg | ORAL_TABLET | Freq: Every day | ORAL | 0 refills | Status: DC

## 2018-07-16 ENCOUNTER — Encounter (HOSPITAL_COMMUNITY): Payer: Self-pay | Admitting: Psychiatry

## 2018-07-16 ENCOUNTER — Ambulatory Visit (INDEPENDENT_AMBULATORY_CARE_PROVIDER_SITE_OTHER): Payer: No Typology Code available for payment source | Admitting: Psychiatry

## 2018-07-16 VITALS — BP 122/70 | Ht 73.0 in | Wt 162.0 lb

## 2018-07-16 DIAGNOSIS — F1210 Cannabis abuse, uncomplicated: Secondary | ICD-10-CM

## 2018-07-16 DIAGNOSIS — F3130 Bipolar disorder, current episode depressed, mild or moderate severity, unspecified: Secondary | ICD-10-CM | POA: Diagnosis not present

## 2018-07-16 MED ORDER — LAMOTRIGINE 100 MG PO TABS: 100.0000 mg | ORAL_TABLET | Freq: Every day | ORAL | 0 refills | Status: AC
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
## 2018-10-13 ENCOUNTER — Ambulatory Visit (HOSPITAL_COMMUNITY): Payer: Non-veteran care | Admitting: Psychiatry

## 2019-12-28 ENCOUNTER — Ambulatory Visit (INDEPENDENT_AMBULATORY_CARE_PROVIDER_SITE_OTHER): Payer: Self-pay

## 2019-12-28 ENCOUNTER — Other Ambulatory Visit: Payer: Self-pay

## 2019-12-28 ENCOUNTER — Other Ambulatory Visit: Payer: Self-pay | Admitting: Physician Assistant

## 2019-12-28 DIAGNOSIS — T1490XA Injury, unspecified, initial encounter: Secondary | ICD-10-CM
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
